# Patient Record
Sex: Male | Born: 1960 | Race: White | Hispanic: No | Marital: Married | Smoking: Current every day smoker
Health system: Southern US, Community
[De-identification: ages and names within clinical notes are randomized; demographics above are authoritative.]

---

## 2002-04-24 ENCOUNTER — Emergency Department (HOSPITAL_COMMUNITY): Admission: EM | Admit: 2002-04-24 | Discharge: 2002-04-25 | Payer: Self-pay | Admitting: Emergency Medicine

## 2002-04-25 ENCOUNTER — Encounter: Payer: Self-pay | Admitting: Emergency Medicine

## 2008-04-23 ENCOUNTER — Emergency Department (HOSPITAL_COMMUNITY): Admission: EM | Admit: 2008-04-23 | Discharge: 2008-04-23 | Payer: Self-pay | Admitting: Emergency Medicine

## 2009-05-31 ENCOUNTER — Emergency Department (HOSPITAL_COMMUNITY): Admission: EM | Admit: 2009-05-31 | Discharge: 2009-06-01 | Payer: Self-pay | Admitting: Emergency Medicine

## 2012-09-06 ENCOUNTER — Emergency Department (HOSPITAL_COMMUNITY)
Admission: EM | Admit: 2012-09-06 | Discharge: 2012-09-06 | Disposition: A | Discharge status: Home / Self Care | Payer: Self-pay | Attending: Emergency Medicine | Admitting: Emergency Medicine

## 2012-09-06 ENCOUNTER — Encounter (HOSPITAL_COMMUNITY): Payer: Self-pay | Admitting: Adult Health

## 2012-09-06 ENCOUNTER — Emergency Department (HOSPITAL_COMMUNITY): Payer: Self-pay

## 2012-09-06 DIAGNOSIS — K029 Dental caries, unspecified: Secondary | ICD-10-CM

## 2012-09-06 DIAGNOSIS — K4090 Unilateral inguinal hernia, without obstruction or gangrene, not specified as recurrent: Secondary | ICD-10-CM

## 2012-09-06 DIAGNOSIS — M542 Cervicalgia: Secondary | ICD-10-CM | POA: Insufficient documentation

## 2012-09-06 DIAGNOSIS — F17200 Nicotine dependence, unspecified, uncomplicated: Secondary | ICD-10-CM | POA: Insufficient documentation

## 2012-09-06 DIAGNOSIS — Z792 Long term (current) use of antibiotics: Secondary | ICD-10-CM | POA: Insufficient documentation

## 2012-09-06 DIAGNOSIS — K089 Disorder of teeth and supporting structures, unspecified: Secondary | ICD-10-CM | POA: Insufficient documentation

## 2012-09-06 LAB — CBC WITH DIFFERENTIAL/PLATELET
Hemoglobin: 14.5 g/dL (ref 13.0–17.0)
Lymphocytes Relative: 17 % (ref 12–46)
Lymphs Abs: 1.6 10*3/uL (ref 0.7–4.0)
MCV: 93.8 fL (ref 78.0–100.0)
Neutrophils Relative %: 71 % (ref 43–77)
Platelets: 166 10*3/uL (ref 150–400)
RBC: 4.39 MIL/uL (ref 4.22–5.81)
WBC: 9 10*3/uL (ref 4.0–10.5)

## 2012-09-06 LAB — BASIC METABOLIC PANEL
CO2: 27 mEq/L (ref 19–32)
Chloride: 103 mEq/L (ref 96–112)
Glucose, Bld: 142 mg/dL — ABNORMAL HIGH (ref 70–99)
Potassium: 3.7 mEq/L (ref 3.5–5.1)
Sodium: 140 mEq/L (ref 135–145)

## 2012-09-06 MED ORDER — ONDANSETRON HCL 4 MG/2ML IJ SOLN
4.0000 mg | Freq: Once | INTRAMUSCULAR | Status: AC
  Administered 2012-09-06: 4 mg via INTRAVENOUS
  Filled 2012-09-06: qty 2

## 2012-09-06 MED ORDER — SODIUM CHLORIDE 0.9 % IV BOLUS (SEPSIS)
1000.0000 mL | Freq: Once | INTRAVENOUS | Status: AC
  Administered 2012-09-06: 1000 mL via INTRAVENOUS

## 2012-09-06 MED ORDER — HYDROCODONE-ACETAMINOPHEN 5-325 MG PO TABS: 1.0000 | ORAL_TABLET | ORAL | Status: DC | PRN

## 2012-09-06 MED ORDER — MORPHINE SULFATE 4 MG/ML IJ SOLN
4.0000 mg | Freq: Once | INTRAMUSCULAR | Status: AC
  Administered 2012-09-06: 4 mg via INTRAVENOUS
  Filled 2012-09-06: qty 1

## 2012-09-06 MED ORDER — PENICILLIN V POTASSIUM 500 MG PO TABS: 500.0000 mg | ORAL_TABLET | Freq: Three times a day (TID) | ORAL | Status: DC

## 2012-09-06 MED ORDER — IOHEXOL 300 MG/ML  SOLN
75.0000 mL | Freq: Once | INTRAMUSCULAR | Status: AC | PRN
  Administered 2012-09-06: 75 mL via INTRAVENOUS

## 2012-09-06 NOTE — ED Provider Notes (Signed)
Medical screening examination/treatment/procedure(s) were performed by non-physician practitioner and as supervising physician I was immediately available for consultation/collaboration.   Johntae Broxterman, MD 09/06/12 2126 

## 2012-09-06 NOTE — ED Notes (Signed)
Pt c/o swelling and pain to left side of mouth and neck. Pt states pain has been going on for 3 days. Visible swelling noted to left side of mouth, poor dental hygiene. Pt rates pain 8/10. Pt states he has been taking amoxicillin that he got from a friend for two days.

## 2012-09-06 NOTE — ED Notes (Signed)
Discharge instructions reviewed. Pt verbalized understanding.  

## 2012-09-06 NOTE — ED Provider Notes (Signed)
CSN: [id number]     Arrival date & time 09/06/12  1913 History   First MD Initiated Contact with Patient 09/06/12 1942     Chief Complaint  Patient presents with  . Dental Pain   (Consider location/radiation/quality/duration/timing/severity/associated sxs/prior Treatment) HPI Comments: The patient is a 52 year old otherwise healthy male who presents with dental pain that started gradually 3 days ago. The dental pain is severe, constant and progressively worsening. The pain is aching and located in his left lower jaw. The pain radiates into his left neck and has associated swelling. Palpation of the neck makes the pain worse. Nothing makes the pain better. The patient has not tried anything for pain. No associated "feeling ill." Patient denies headache, fever, NVD, edema, sore throat, throat swelling, wheezing, SOB, chest pain, abdominal pain.     Patient is a 52 y.o. male presenting with tooth pain.  Dental Pain Associated symptoms: neck pain     History reviewed. No pertinent past medical history. History reviewed. No pertinent past surgical history. History reviewed. No pertinent family history. History  Substance Use Topics  . Smoking status: Current Every Day Smoker    Types: Cigarettes  . Smokeless tobacco: Not on file  . Alcohol Use: No   OB History   Grav Para Term Preterm Abortions TAB SAB Ect Mult Living                 Review of Systems  HENT: Positive for neck pain and dental problem.   All other systems reviewed and are negative.    Allergies  Review of patient's allergies indicates no known allergies.  Home Medications   Current Outpatient Rx  Name  Route  Sig  Dispense  Refill  . amoxicillin (AMOXIL) 875 MG tablet   Oral   Take 875 mg by mouth 2 (two) times daily.          BP 117/76  Pulse 111  Temp(Src) 98.9 F (37.2 C) (Oral)  Resp 16  Ht 5\' 9"  (1.753 m)  Wt 130 lb 9 oz (59.223 kg)  BMI 19.27 kg/m2  SpO2 98% Physical Exam  Nursing note and  vitals reviewed. Constitutional: She is oriented to person, place, and time. She appears well-developed and well-nourished. No distress.  HENT:  Head: Normocephalic and atraumatic.  Mouth/Throat: Oropharynx is clear and moist. No oropharyngeal exudate.  Eyes: Conjunctivae and EOM are normal.  Neck: Normal range of motion.  Left submandibular tenderness to palpation and edema. No other focal tenderness to palpation or swelling noted.   Cardiovascular: Normal rate and regular rhythm.  Exam reveals no gallop and no friction rub.   No murmur heard. Pulmonary/Chest: Effort normal and breath sounds normal. She has no wheezes. She has no rales. She exhibits no tenderness.  Abdominal: Soft. There is no tenderness.  Musculoskeletal: Normal range of motion.  Neurological: She is alert and oriented to person, place, and time. Coordination normal.  Speech is goal-oriented. Moves limbs without ataxia.   Skin: Skin is warm and dry.  Psychiatric: She has a normal mood and affect. Her behavior is normal.    ED Course  Procedures (including critical care time) Labs Review Labs Reviewed  CBC WITH DIFFERENTIAL  BASIC METABOLIC PANEL   Imaging Review No results found.  MDM  No diagnosis found.  7:54 PM Labs pending. Patient will have IV fluids and pain medication. Patient will have CT neck with contrast to rule out abscess. Patient signed out to Felicie Morn, NP  for disposition.     Emilia Beck, PA-C 09/06/12 2022

## 2012-09-06 NOTE — ED Notes (Signed)
Presents with left lower dental pain that radiates to neck and ear. Dental caries noted. Also reports right inguinal hernia that pops out occaisionally. Not out at this time.

## 2013-08-26 ENCOUNTER — Emergency Department (HOSPITAL_COMMUNITY)
Admission: EM | Admit: 2013-08-26 | Discharge: 2013-08-26 | Disposition: A | Discharge status: Home / Self Care | Payer: Self-pay | Attending: Emergency Medicine | Admitting: Emergency Medicine

## 2013-08-26 ENCOUNTER — Emergency Department (HOSPITAL_COMMUNITY): Payer: Self-pay

## 2013-08-26 ENCOUNTER — Encounter (HOSPITAL_COMMUNITY): Payer: Self-pay | Admitting: Emergency Medicine

## 2013-08-26 DIAGNOSIS — S62319A Displaced fracture of base of unspecified metacarpal bone, initial encounter for closed fracture: Secondary | ICD-10-CM | POA: Insufficient documentation

## 2013-08-26 DIAGNOSIS — Y9389 Activity, other specified: Secondary | ICD-10-CM | POA: Insufficient documentation

## 2013-08-26 DIAGNOSIS — S6990XA Unspecified injury of unspecified wrist, hand and finger(s), initial encounter: Secondary | ICD-10-CM | POA: Insufficient documentation

## 2013-08-26 DIAGNOSIS — Y929 Unspecified place or not applicable: Secondary | ICD-10-CM | POA: Insufficient documentation

## 2013-08-26 DIAGNOSIS — Z792 Long term (current) use of antibiotics: Secondary | ICD-10-CM | POA: Insufficient documentation

## 2013-08-26 DIAGNOSIS — S62309A Unspecified fracture of unspecified metacarpal bone, initial encounter for closed fracture: Secondary | ICD-10-CM

## 2013-08-26 DIAGNOSIS — F17200 Nicotine dependence, unspecified, uncomplicated: Secondary | ICD-10-CM | POA: Insufficient documentation

## 2013-08-26 DIAGNOSIS — IMO0002 Reserved for concepts with insufficient information to code with codable children: Secondary | ICD-10-CM | POA: Insufficient documentation

## 2013-08-26 MED ORDER — OXYCODONE-ACETAMINOPHEN 5-325 MG PO TABS
2.0000 | ORAL_TABLET | Freq: Once | ORAL | Status: DC
Start: 2013-08-26 — End: 2013-08-26

## 2013-08-26 MED ORDER — HYDROMORPHONE HCL PF 1 MG/ML IJ SOLN
1.0000 mg | Freq: Once | INTRAMUSCULAR | Status: AC
  Administered 2013-08-26: 1 mg via INTRAMUSCULAR
  Filled 2013-08-26: qty 1

## 2013-08-26 MED ORDER — HYDROCODONE-ACETAMINOPHEN 5-325 MG PO TABS: 1.0000 | ORAL_TABLET | Freq: Four times a day (QID) | ORAL | Status: DC | PRN

## 2013-08-26 MED ORDER — NAPROXEN 500 MG PO TABS: 500.0000 mg | ORAL_TABLET | Freq: Two times a day (BID) | ORAL | Status: AC

## 2013-08-26 NOTE — ED Notes (Signed)
Rt hand pain after falling on it last night swelling

## 2013-08-26 NOTE — ED Notes (Signed)
Hand Surgeon at the bedside.  
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
## 2013-08-26 NOTE — Discharge Instructions (Signed)
Call Dr. Carlos Levering office in 2 weeks for a follow up appointment. Continue to wear your brace until you have been told to remove it by Dr. Amanda Pea. Elevate your hand above your heart to reduce swelling, Ice 3-4 times a day. Call for a follow up appointment with a Family or Primary Care Provider.  Return if Symptoms worsen.   Take medication as prescribed.     Emergency Department Resource Guide 1) Find a Doctor and Pay Out of Pocket Although you won't have to find out who is covered by your insurance plan, it is a good idea to ask around and get recommendations. You will then need to call the office and see if the doctor you have chosen will accept you as a new patient and what types of options they offer for patients who are self-pay. Some doctors offer discounts or will set up payment plans for their patients who do not have insurance, but you will need to ask so you aren't surprised when you get to your appointment.  2) Contact Your Local Health Department Not all health departments have doctors that can see patients for sick visits, but many do, so it is worth a call to see if yours does. If you don't know where your local health department is, you can check in your phone book. The CDC also has a tool to help you locate your state's health department, and many state websites also have listings of all of their local health departments.  3) Find a Walk-in Clinic If your illness is not likely to be very severe or complicated, you may want to try a walk in clinic. These are popping up all over the country in pharmacies, drugstores, and shopping centers. They're usually staffed by nurse practitioners or physician assistants that have been trained to treat common illnesses and complaints. They're usually fairly quick and inexpensive. However, if you have serious medical issues or chronic medical problems, these are probably not your best option.  No Primary Care Doctor: - Call Health Connect at   [phone] - they can help you locate a primary care doctor that  accepts your insurance, provides certain services, etc. - Physician Referral Service- [phone]  Chronic Pain Problems: Organization         Address  Phone   Notes  [facility]  [phone] Patients need to be referred by their primary care doctor.   Medication Assistance: Organization         Address  Phone   Notes  [facility] Medication [facility] [address] [place], [place] [address] [phone] --Must be a resident of [facility] -- Must have NO insurance coverage whatsoever (no Medicaid/ Medicare, etc.) -- The pt. MUST have a primary care doctor that directs their care regularly and follows them in the community   MedAssist  [phone]   [employer]  [phone]    Agencies that provide inexpensive medical care: Organization         Address  Phone   Notes  [facility]  [phone]   [facility]    [phone]   [facility] [address] [place], [place] [address] [phone]   [facility] [address] [place] [place] [phone]   Planned Parenthood    [phone]   [facility]    [phone]   [facility]  [address], [place] Phone:  [phone], Fax:  [phone] Hours of Operation:  9 am - 6 pm, M-F.  Also accepts Medicaid/Medicare and self-pay.  [facility]  [address], [place] Phone: [phone]) [phone], Fax: [phone]. Hours of Operation:  8:30 am - 5:30 pm, M-F.  Also accepts Medicaid and self-pay.  [facility] [place] [address], [place] Phone: [phone](336) [phone]   [facility] [address], [place], [place] [phone], Ext. [phone] Mondays & Thursdays: 7-9 AM.  First 15 patients are seen on a first come, first serve basis.     Medicaid-accepting [facility] Providers:  Organization         Address  Phone   Notes  [facility] [address], [place] [phone] Also accepts self-pay patients.  [facility] [address] [place] [address], [place]  [phone](336) [phone]   [facility] [address], [place] [phone]) [phone]   [facility] [address] [place] [address], [place] [phone]) [phone]   Renaye RakersVeita Bland [address], [place]   [phone] Only accepts [place] Access [place] patients after they have their name applied to their card.   Self-Pay (no insurance) in [facility]:  Organization         Address  Phone   Notes  Sickle Cell Patients, [facility] [address], [place] [phone]) [phone]   [facility] [address], [place] [phone]) [phone]   [facility] Olivia Lopez de Gutierrez  [address], [place] [phone]) [phone]   [facility]/Dr. Osei-Bonsu  [address] [place] [address], [place] or [phone], [place] [phone]) [phone] Phone number for both [place] and [place] locations is the same.  [facility] [address], [place] [phone]) [phone]   [facility] [place] [address] [place] [address], [place] or [address] [phone]) [phone]   [facility] [address], [place] [phone], phone; [phone](336) [phone], fax Sees patients 1st and 3rd Saturday of every month.  Must not qualify for public or private insurance (i.e. Medicaid, Medicare, [place] Health Choice, Veterans' Benefits)  Household income should be no more than 200% of the poverty level The clinic cannot treat you if you are pregnant or think you are pregnant  Sexually transmitted diseases are not treated at the clinic.    Dental Care: Organization         Address  Phone  Notes  [facility]  [facility] [address], [place] [phone] Accepts children up to age 53 who are enrolled in [place] or [place] Health Choice; pregnant women with a Medicaid card; and children who have applied for Medicaid or [place] Health Choice, but were declined, whose parents can pay a reduced fee at time of service.  [facility] Department of [facility] [place]  [address], [place] [phone]) [phone] Accepts children up to age 11021 who are enrolled in [place] or [place] Health Choice; pregnant women with a Medicaid card; and children who have applied for Medicaid or [place] Health Choice, but were declined, whose parents can pay a reduced fee at time of service.  [facility]  [address], [place] [phone]) [phone]  Patients are seen by appointment only. Walk-ins are not accepted. [place] will see patients 64 years of age and older. Monday - Tuesday (8am-5pm) Most Wednesdays (8:30-5pm) $30 per visit, cash only  [facility]  [address], [facility] [phone] Patients are seen by appointment only. Walk-ins are not accepted. [place] will see patients 66 years of age and older. One Wednesday Evening (Monthly: Volunteer Based).  $30 per visit, cash only  [place]  [phone] for adults; Children under age 47, call Graduate Pediatric Dentistry at [phone]. Children aged 45-14, please call [phone] to request a pediatric application.  Dental services are provided in all areas of dental care including fillings, crowns and bridges, complete and partial dentures, implants, gum treatment, root canals, and extractions. Preventive care is also provided. Treatment is provided to both adults and children. Patients are selected via a lottery and there is often a waiting list.   [facility] [address] [place]  [phone]  [email or web address]   [facility] [address] [place], [place] [phone], Ext. [phone] Second and Fourth Thursday of each month, opens at 6:30 AM; Clinic ends at 9 AM.  Patients are seen on a first-come first-served basis, and a limited number are seen during each clinic.   [facility]  [address]anker [place], [place] [phone]   Eligibility Requirements You must have lived in [place], [place], or [place] counties for at least the last three months.   You cannot be eligible for state or federal sponsored [employer], including [employer], [place], or [employer].   You generally cannot be eligible for healthcare insurance through your employer.    How to apply: Eligibility screenings are held every Tuesday and Wednesday afternoon from 1:00 pm until 4:00 pm. You do not need an appointment for the interview!  [facility] [address] [place], [place]   [place]  [place] Department  [place]  [phone]    Behavioral Health Resources in the Community: Intensive Outpatient Programs Organization         Address  Phone  Notes  [place]. [address] [place], [place] [phone]   [facility] Outpatient [address] [place], [place]   ADS: Alcohol & Drug S[facility]cs [address], [place], [place]   [place] [address]  [place], [place] or [phone]   Substance Abuse Resources Organization         Address  Phone  Notes  Alcohol and Drug Services  [phone]   [place]  [phone]   [facility] [place]   Chinita Pester  [phone]   Residential & Outpatient Substance Abuse Program  [phone]   Psychological Services Organization          Address  Phone  Notes  [facility] [place]  [place]  [phone]   [place] [address], [place] or [phone]    Mobile Crisis Teams Organization         Address  Phone  Notes  [facility], Mobile Crisis Care Unit  [phone]   [facility]  [address] [place], [place]   [facility] [address] [place]  [address] [place] [phone]    Self-Help/Support Groups Organization         Address  Phone             Notes  [facility]ental Health Assoc. of [place] - variety of support groups  [phone]- I7437963 Call for more information  Narcotics Anonymous (NA), [facility] [address], [employer] [place]  2 meetings at this location   Statistician         Address  Phone  Notes  [facility] [address],    [place]  [phone]   [facility]  [address] [place] [address]213, [place], [place] [phone]   [facility] [address] [place], [place] [phone] Admissions: 8am-3pm M-F  [facility] [address]    [place], [place] [phone]   [facility] [address] [place], [place], [place] [phone]   [facility] [address],  [place], [place] [phone]   [facility] - Intensive Outpatient [address], [place], [place] [phone]   [facility] (Addiction Recovery Care [facility] [address] [place].,  [place], [place] [phone] or [phone]   [facility] (RTS) [address], [place], [place] [phone] Accepts Medicaid  [facility] [place] [address],  [place] [phone] Substance Abuse/Addiction Treatment   [facility] Organization         Address  Phone  Notes  [facility]  [phone]   Angie Fava, PhD [address] [place], [place]   [phone] or [phone]   [facility]   [address] [place], [place] [phone]   [facility] [address] [place] [address], [place], [place] [phone] Insurance/Medicaid/sponsorship through [facility] Families [address]                                    [place], [place] [phone] Therapy/tele-psych/case  [facility] [address], [place] [phone]    Dr. Lolly Mustache  [phone]   [facility] [place]  [employer] [facility] Dept. 1) [address], [place] 2) [address] [place] 3)  [address], [place] [phone]  [phone]   [facility] Child Abuse Hotline 845-194-1395 or [phone] (After Hours)

## 2013-08-26 NOTE — ED Provider Notes (Signed)
CSN: [id number]     Arrival date & time 08/26/13  1051 History  This chart was scribed for non-physician practitioner, Mellody DrownLauren Karlin Binion, PA-C working with Rolland PorterMark James, MD by Greggory StallionKayla Andersen, ED scribe. This patient was seen in room TR04C/TR04C and the patient's care was started at 11:34 AM.   Chief Complaint  Patient presents with  . Hand Pain   HPI Comments: Todd Huber is a 53 y.o. male who presents to the Emergency Department complaining of right hand pain since last night. The patient reports he punched a TV stand last night. Denies elbow or shoulder pain.   The history is provided by the patient. No language interpreter was used.    History reviewed. No pertinent past medical history. History reviewed. No pertinent past surgical history. No family history on file. History  Substance Use Topics  . Smoking status: Current Every Day Smoker    Types: Cigarettes  . Smokeless tobacco: Not on file  . Alcohol Use: No    Review of Systems  Musculoskeletal: Positive for arthralgias and joint swelling.  All other systems reviewed and are negative.  Allergies  Review of patient's allergies indicates no known allergies.  Home Medications   Prior to Admission medications   Medication Sig Start Date End Date Taking? Authorizing Provider  amoxicillin (AMOXIL) 875 MG tablet Take 875 mg by mouth 2 (two) times daily.    Historical Provider, MD  HYDROcodone-acetaminophen (NORCO/VICODIN) 5-325 MG per tablet Take 1 tablet by mouth every 4 (four) hours as needed for pain. 09/06/12   Jimmye Normanavid John Smith, NP  penicillin v potassium (VEETID) 500 MG tablet Take 1 tablet (500 mg total) by mouth 3 (three) times daily. 09/06/12   Jimmye Normanavid John Smith, NP   BP 115/74  Pulse 83  Temp(Src) 98.2 F (36.8 C) (Oral)  Resp 16  SpO2 99%  Physical Exam  Nursing note and vitals reviewed. Constitutional: He is oriented to person, place, and time. He appears well-developed and well-nourished.  Non-toxic appearance. He  does not have a sickly appearance. He does not appear ill. No distress.  HENT:  Head: Normocephalic and atraumatic.  Eyes: Conjunctivae and EOM are normal.  Neck: Neck supple.  Pulmonary/Chest: Effort normal. No respiratory distress.  Musculoskeletal: Normal range of motion.       Right hand: He exhibits bony tenderness and swelling.  Loss of the fifth knuckle contour, tenderness palpation over the fifth metacarpal good cap refill good sensation.  Neurological: He is alert and oriented to person, place, and time.  Skin: Skin is warm and dry. He is not diaphoretic.  Psychiatric: He has a normal mood and affect. His behavior is normal.    ED Course  Procedures (including critical care time)  COORDINATION OF CARE: 11:34 AM-Discussed treatment plan which includes xray with pt at bedside and pt agreed to plan.   Labs Review Labs Reviewed - No data to display  Imaging Review Dg Hand Complete Right  08/26/2013   CLINICAL DATA:  Pain.  EXAM: RIGHT HAND - COMPLETE 3+ VIEW  COMPARISON:  None.  FINDINGS: Slightly comminuted angulated fracture of the base of the right fifth metacarpal is present. No other associated abnormalities identified.  IMPRESSION: Slightly comminuted angulated fracture base right fifth metacarpal present.   Electronically Signed   By: Maisie Fushomas  Register   On: 08/26/2013 11:48     EKG Interpretation None      MDM   Final diagnoses:  Metacarpal bone fracture, closed, initial encounter  And presents with a slightly comminuted angulated fracture at base of the fifth metacarpal. Discussed with Dr. Lucilla Lame PA in ED. Reduction performed in ED. Pt to followup with [facility] in 2 weeks.   Meds given in ED:  Medications  HYDROmorphone (DILAUDID) injection 1 mg (1 mg Intramuscular Given 08/26/13 1255)    New Prescriptions   HYDROCODONE-ACETAMINOPHEN (NORCO/VICODIN) 5-325 MG PER TABLET    Take 1 tablet by mouth every 6 (six) hours as needed.   NAPROXEN (NAPROSYN)  500 MG TABLET    Take 1 tablet (500 mg total) by mouth 2 (two) times daily with a meal.   I personally performed the services described in this documentation, which was scribed in my presence. The recorded information has been reviewed and is accurate.  Mellody Drown, PA-C 08/26/13 1430

## 2013-08-26 NOTE — Progress Notes (Signed)
Orthopedic Tech Progress Note Patient Details:  Todd Huber 10-05-1960 [id number]  Casting Type of Cast: Short arm cast Cast Material: Fiberglass     CammerMickie Bail, [name] Carol 08/26/2013, 3:27 PM

## 2013-08-29 NOTE — ED Provider Notes (Signed)
Medical screening examination/treatment/procedure(s) were performed by non-physician practitioner and as supervising physician I was immediately available for consultation/collaboration.   EKG Interpretation None        Rolland PorterMark Heith Haigler, MD 08/29/13 2354

## 2014-01-01 ENCOUNTER — Encounter (HOSPITAL_COMMUNITY): Payer: Self-pay | Admitting: Emergency Medicine

## 2014-01-01 ENCOUNTER — Emergency Department (HOSPITAL_COMMUNITY)
Admission: EM | Admit: 2014-01-01 | Discharge: 2014-01-02 | Disposition: A | Discharge status: Home / Self Care | Payer: Self-pay | Attending: Emergency Medicine | Admitting: Emergency Medicine

## 2014-01-01 DIAGNOSIS — Z23 Encounter for immunization: Secondary | ICD-10-CM | POA: Insufficient documentation

## 2014-01-01 DIAGNOSIS — Y9289 Other specified places as the place of occurrence of the external cause: Secondary | ICD-10-CM | POA: Insufficient documentation

## 2014-01-01 DIAGNOSIS — Z8781 Personal history of (healed) traumatic fracture: Secondary | ICD-10-CM | POA: Insufficient documentation

## 2014-01-01 DIAGNOSIS — S61212A Laceration without foreign body of right middle finger without damage to nail, initial encounter: Secondary | ICD-10-CM | POA: Insufficient documentation

## 2014-01-01 DIAGNOSIS — Z720 Tobacco use: Secondary | ICD-10-CM | POA: Insufficient documentation

## 2014-01-01 DIAGNOSIS — Y288XXA Contact with other sharp object, undetermined intent, initial encounter: Secondary | ICD-10-CM | POA: Insufficient documentation

## 2014-01-01 DIAGNOSIS — Y9389 Activity, other specified: Secondary | ICD-10-CM | POA: Insufficient documentation

## 2014-01-01 DIAGNOSIS — S6991XA Unspecified injury of right wrist, hand and finger(s), initial encounter: Secondary | ICD-10-CM | POA: Insufficient documentation

## 2014-01-01 DIAGNOSIS — IMO0002 Reserved for concepts with insufficient information to code with codable children: Secondary | ICD-10-CM

## 2014-01-01 DIAGNOSIS — Z79899 Other long term (current) drug therapy: Secondary | ICD-10-CM | POA: Insufficient documentation

## 2014-01-01 DIAGNOSIS — Z791 Long term (current) use of non-steroidal anti-inflammatories (NSAID): Secondary | ICD-10-CM | POA: Insufficient documentation

## 2014-01-01 DIAGNOSIS — Y998 Other external cause status: Secondary | ICD-10-CM | POA: Insufficient documentation

## 2014-01-01 NOTE — ED Notes (Signed)
Pt with laceration to R middle finger from broken dish. Bleeding controlled in triage. Pt also c/o pain to R hand d/t previous break.

## 2014-01-02 MED ORDER — LIDOCAINE HCL (PF) 1 % IJ SOLN
5.0000 mL | Freq: Once | INTRAMUSCULAR | Status: AC
  Administered 2014-01-02: 5 mL via INTRADERMAL
  Filled 2014-01-02: qty 5

## 2014-01-02 MED ORDER — TETANUS-DIPHTH-ACELL PERTUSSIS 5-2.5-18.5 LF-MCG/0.5 IM SUSP: 0.5000 mL | Freq: Once | INTRAMUSCULAR | Status: DC

## 2014-01-02 MED ORDER — TETANUS-DIPHTH-ACELL PERTUSSIS 5-2.5-18.5 LF-MCG/0.5 IM SUSP
0.5000 mL | Freq: Once | INTRAMUSCULAR | Status: AC
  Administered 2014-01-02: 0.5 mL via INTRAMUSCULAR
  Filled 2014-01-02: qty 0.5

## 2014-01-02 NOTE — ED Provider Notes (Signed)
CSN: [id number]     Arrival date & time 01/01/14  2332 History  This chart was scribed for non-physician practitioner working with Todd CrumbleAdeleke Oni, MD by Richarda Overlieichard Holland, ED Scribe. This patient was seen in room D35C/D35C and the patient's care was started at 12:05 AM.    Chief Complaint  Patient presents with  . Laceration   Patient is a 53 y.o. male presenting with skin laceration. The history is provided by the patient and medical records. No language interpreter was used.  Laceration  HPI Comments: Drue FlirtCraven Z Gangi is a 53 y.o. male who presents to the Emergency Department complaining of a laceration to his right middle finger that occurred 2 hours ago. Pt states he cut his finger on a broken dish. He says that he broke the dish when he cut his finger. He states his last tetanus shot was a couple years ago, but can't remember exactly when. He says that he is right handed. Pt reports no pertinent medical history at this time. He reports no alleviating factors at this time. He reports NKDA.    Pt also complains of right hand pain and reports a he broke his right hand 5 months ago. He states he did not wear the cast or splint as long as he was instructed. He states his hand pain worsens with certain movements.  He reports he did not follow-up with the hand specialist either.    Pt also c/o a right lower abd hernia that "feels full" but is not painful.  Pt reports this has been present > 6mos, but he has not sought treatment for this.  Pt denies abd pain, N/V/D, weakness, dizziness, syncope, dysuria, hematuria, testicular pain or penile pain.    History reviewed. No pertinent past medical history. History reviewed. No pertinent past surgical history. No family history on file. History  Substance Use Topics  . Smoking status: Current Every Day Smoker    Types: Cigarettes  . Smokeless tobacco: Not on file  . Alcohol Use: No    Review of Systems  Constitutional: Negative for fever, diaphoresis,  appetite change, fatigue and unexpected weight change.  HENT: Negative for mouth sores.   Eyes: Negative for visual disturbance.  Respiratory: Negative for cough, chest tightness, shortness of breath and wheezing.   Cardiovascular: Negative for chest pain.  Gastrointestinal: Negative for nausea, vomiting, abdominal pain, diarrhea and constipation.       Hernia  Endocrine: Negative for polydipsia, polyphagia and polyuria.  Genitourinary: Negative for dysuria, urgency, frequency and hematuria.  Musculoskeletal: Positive for arthralgias (right hand). Negative for back pain and neck stiffness.  Skin: Positive for wound. Negative for rash.  Allergic/Immunologic: Negative for immunocompromised state.  Neurological: Negative for syncope, weakness, light-headedness, numbness and headaches.  Hematological: Does not bruise/bleed easily.  Psychiatric/Behavioral: Negative for sleep disturbance. The patient is not nervous/anxious.   All other systems reviewed and are negative.   Allergies  Review of patient's allergies indicates no known allergies.  Home Medications   Prior to Admission medications   Medication Sig Start Date End Date Taking? Authorizing Provider  acetaminophen (TYLENOL) 325 MG tablet Take 650 mg by mouth every 6 (six) hours as needed for headache.    Historical Provider, MD  HYDROcodone-acetaminophen (NORCO/VICODIN) 5-325 MG per tablet Take 1 tablet by mouth every 6 (six) hours as needed. 08/26/13   Mellody DrownLauren Parker, PA-C  naproxen (NAPROSYN) 500 MG tablet Take 1 tablet (500 mg total) by mouth 2 (two) times daily with a meal. 08/26/13  Lauren Jimmey RalphParker, PA-C   BP 128/73 mmHg  Pulse 100  Temp(Src) 98.3 F (36.8 C) (Oral)  Resp 18  Ht 5\' 9"  (1.753 m)  Wt 137 lb (62.143 kg)  BMI 20.22 kg/m2  SpO2 96% Physical Exam  Constitutional: He is oriented to person, place, and time. He appears well-developed and well-nourished. No distress.  Awake, alert, nontoxic appearance  HENT:  Head:  Normocephalic and atraumatic.  Mouth/Throat: Oropharynx is clear and moist. No oropharyngeal exudate.  Eyes: Conjunctivae are normal. Right eye exhibits no discharge. Left eye exhibits no discharge. No scleral icterus.  Neck: Normal range of motion. Neck supple. No tracheal deviation present.  Cardiovascular: Normal rate, regular rhythm, normal heart sounds and intact distal pulses.   No murmur heard. Capillary refill < 3 sec  Pulmonary/Chest: Effort normal and breath sounds normal. No respiratory distress. He has no wheezes.  Equal chest expansion  Abdominal: Soft. Bowel sounds are normal. He exhibits no distension and no mass. There is no tenderness. There is no rebound and no guarding. A hernia is present. Hernia confirmed positive in the right inguinal area. Hernia confirmed negative in the left inguinal area.  abd soft and nontender  Genitourinary: Testes normal and penis normal.     Easily reducible direct inguinal hernia  Musculoskeletal: Normal range of motion. He exhibits no edema.  ROM: full ROM of all fingers of the right hand, right rist and right elbow 3cm laceration to the right middle finger  Lymphadenopathy:       Right: No inguinal adenopathy present.       Left: No inguinal adenopathy present.  Neurological: He is alert and oriented to person, place, and time.  Sensation: intact to dull and sharp in all fingers of the right hand, including the tip of the right middle finger Strength: 5/5 including strong grip strength  Skin: Skin is warm and dry. He is not diaphoretic.  Psychiatric: He has a normal mood and affect. His behavior is normal.  Nursing note and vitals reviewed.   ED Course  LACERATION REPAIR Date/Time: 01/02/2014 12:54 AM Performed by: Dierdre ForthMUTHERSBAUGH, Easton Sivertson Authorized by: Dierdre ForthMUTHERSBAUGH, Deyra Perdomo Consent: Verbal consent obtained. Risks and benefits: risks, benefits and alternatives were discussed Consent given by: patient Patient understanding: patient  states understanding of the procedure being performed Patient consent: the patient's understanding of the procedure matches consent given Procedure consent: procedure consent matches procedure scheduled Relevant documents: relevant documents present and verified Site marked: the operative site was marked Required items: required blood products, implants, devices, and special equipment available Patient identity confirmed: verbally with patient and arm band Time out: Immediately prior to procedure a "time out" was called to verify the correct patient, procedure, equipment, support staff and site/side marked as required. Body area: upper extremity Location details: right long finger Laceration length: 3 cm Foreign bodies: no foreign bodies Tendon involvement: none Nerve involvement: none Vascular damage: no Anesthesia: digital block Local anesthetic: lidocaine 1% without epinephrine Anesthetic total: 5 ml Patient sedated: no Preparation: Patient was prepped and draped in the usual sterile fashion. Irrigation solution: saline Irrigation method: syringe Amount of cleaning: extensive Debridement: none Degree of undermining: none Skin closure: 5-0 Prolene Number of sutures: 5 Technique: simple Approximation: close Approximation difficulty: complex Dressing: 4x4 sterile gauze Patient tolerance: Patient tolerated the procedure well with no immediate complications     DIAGNOSTIC STUDIES: Oxygen Saturation is 96% on RA, normal by my interpretation.    COORDINATION OF CARE: 12:15 AM Discussed treatment plan with pt  at bedside and pt agreed to plan.   Labs Review Labs Reviewed - No data to display  Imaging Review No results found.   EKG Interpretation None      MDM   Final diagnoses:  Laceration of right middle finger w/o foreign body w/o damage to nail, initial encounter    CHRISTOS MIXSON presents with laceration to the right middle finger.  Tdap booster given.Pressure  irrigation performed. Laceration occurred < 8 hours prior to repair which was well tolerated. Pt has no co morbidities to effect normal wound healing. Discussed suture home care w pt and answered questions. Pt to f-u for wound check and suture removal in 7 days.   Pt also c/o hernia.  PE with right inguinal hernia, easily reproducible without associated abd pain, N/V or other concerning symptoms.  Pt is to follow-up with [facility] as well as [facility] for this.    Pt also c/o persistent, but intermittent right hand pain with certain movements since he obtained a boxer's fracture approx 5 mos ago.  Sensation, grip strength and ROM are WNL; encouraged f/u with initial hand surgeon.    I have personally reviewed patient's vitals, nursing note and any pertinent labs or imaging.  I performed an focused physical exam; undressed when appropriate .    It has been determined that no acute conditions requiring further emergency intervention are present at this time. The patient/guardian have been advised of the diagnosis and plan. I reviewed any labs and imaging including any potential incidental findings. We have discussed signs and symptoms that warrant return to the ED and they are listed in the discharge instructions.    Vital signs are stable at discharge.   BP 128/73 mmHg  Pulse 100  Temp(Src) 98.3 F (36.8 C) (Oral)  Resp 18  Ht 5\' 9"  (1.753 m)  Wt 137 lb (62.143 kg)  BMI 20.22 kg/m2  SpO2 96%       Dierdre Forth, PA-C 01/02/14 1610  Todd Crumble, MD 01/02/14 [id number]

## 2014-01-02 NOTE — Discharge Instructions (Signed)

## 2014-09-18 ENCOUNTER — Emergency Department (HOSPITAL_COMMUNITY)
Admission: EM | Admit: 2014-09-18 | Discharge: 2014-09-18 | Disposition: A | Discharge status: Home / Self Care | Payer: Self-pay | Attending: Emergency Medicine | Admitting: Emergency Medicine

## 2014-09-18 ENCOUNTER — Encounter (HOSPITAL_COMMUNITY): Payer: Self-pay | Admitting: Emergency Medicine

## 2014-09-18 DIAGNOSIS — K4090 Unilateral inguinal hernia, without obstruction or gangrene, not specified as recurrent: Secondary | ICD-10-CM | POA: Insufficient documentation

## 2014-09-18 DIAGNOSIS — Z720 Tobacco use: Secondary | ICD-10-CM | POA: Insufficient documentation

## 2014-09-18 DIAGNOSIS — Z791 Long term (current) use of non-steroidal anti-inflammatories (NSAID): Secondary | ICD-10-CM | POA: Insufficient documentation

## 2014-09-18 DIAGNOSIS — N451 Epididymitis: Secondary | ICD-10-CM | POA: Insufficient documentation

## 2014-09-18 MED ORDER — CIPROFLOXACIN HCL 500 MG PO TABS: 500.0000 mg | ORAL_TABLET | Freq: Two times a day (BID) | ORAL | Status: AC

## 2014-09-18 MED ORDER — IBUPROFEN 800 MG PO TABS: 800.0000 mg | ORAL_TABLET | Freq: Three times a day (TID) | ORAL | Status: DC

## 2014-09-18 NOTE — ED Provider Notes (Signed)
CSN: [id number]     Arrival date & time 09/18/14  1030 History   First MD Initiated Contact with Patient 09/18/14 1133     Chief Complaint  Patient presents with  . Hernia     (Consider location/radiation/quality/duration/timing/severity/associated sxs/prior Treatment) HPI  R inguinal hernia X months - getting bgger and causing more pain Sx are persistent - has associated testicular pain on the R. But no dysuria / hematuria - some difficulty starting a stream, no n/v/d and no abd pain.  Present with standing and easily reducible.  History reviewed. No pertinent past medical history. History reviewed. No pertinent past surgical history. No family history on file. Social History  Substance Use Topics  . Smoking status: Current Every Day Smoker -- 1.00 packs/day    Types: Cigarettes  . Smokeless tobacco: None  . Alcohol Use: Yes     Comment: daily    Review of Systems  All other systems reviewed and are negative.     Allergies  Review of patient's allergies indicates no known allergies.  Home Medications   Prior to Admission medications   Medication Sig Start Date End Date Taking? Authorizing Provider  acetaminophen (TYLENOL) 325 MG tablet Take 650 mg by mouth every 6 (six) hours as needed for headache.    Historical Provider, MD  ciprofloxacin (CIPRO) 500 MG tablet Take 1 tablet (500 mg total) by mouth 2 (two) times daily. 09/18/14   Eber Hong, MD  HYDROcodone-acetaminophen (NORCO/VICODIN) 5-325 MG per tablet Take 1 tablet by mouth every 6 (six) hours as needed. 08/26/13   Mellody Drown, PA-C  ibuprofen (ADVIL,MOTRIN) 800 MG tablet Take 1 tablet (800 mg total) by mouth 3 (three) times daily. 09/18/14   Eber Hong, MD  naproxen (NAPROSYN) 500 MG tablet Take 1 tablet (500 mg total) by mouth 2 (two) times daily with a meal. 08/26/13   Mellody Drown, PA-C   BP 112/74 mmHg  Pulse 81  Temp(Src) 97.9 F (36.6 C) (Oral)  Resp 20  SpO2 97% Physical Exam  Constitutional: He  appears well-developed and well-nourished. No distress.  HENT:  Head: Normocephalic and atraumatic.  Mouth/Throat: Oropharynx is clear and moist. No oropharyngeal exudate.  Eyes: Conjunctivae and EOM are normal. Pupils are equal, round, and reactive to light. Right eye exhibits no discharge. Left eye exhibits no discharge. No scleral icterus.  Neck: Normal range of motion. Neck supple. No JVD present. No thyromegaly present.  Cardiovascular: Normal rate, regular rhythm, normal heart sounds and intact distal pulses.  Exam reveals no gallop and no friction rub.   No murmur heard. Pulmonary/Chest: Effort normal and breath sounds normal. No respiratory distress. He has no wheezes. He has no rales.  Abdominal: Soft. Bowel sounds are normal. He exhibits no distension and no mass. There is no tenderness.  Genitourinary:  R inguinal hernia - easily reducible, normal cremasteric reflex, mild tenderness over the epididymis of the right testicle, normal-appearing scrotum and testicles, no masses. Normal-appearing penis without urethral discharge.   Musculoskeletal: Normal range of motion. He exhibits no edema or tenderness.  Lymphadenopathy:    He has no cervical adenopathy.  Neurological: He is alert. Coordination normal.  Skin: Skin is warm and dry. No rash noted. No erythema.  Psychiatric: He has a normal mood and affect. His behavior is normal.  Nursing note and vitals reviewed.   ED Course  Procedures (including critical care time) Labs Review Labs Reviewed - No data to display  Imaging Review No results found. I have personally  reviewed and evaluated these images and lab results as part of my medical decision-making.    MDM   Final diagnoses:  Inguinal hernia, right  Epididymitis, right    The patient has a benign abdomen, he has had intermittent hernias over the last several months, these are easily reducible, they never get strangulated, no systemic symptoms, evidence of  epididymitis, antibiotics for same, can follow up outpatient with general surgery, the patient is agreeable to this plan.    Meds given in ED:  Medications - No data to display  New Prescriptions   CIPROFLOXACIN (CIPRO) 500 MG TABLET    Take 1 tablet (500 mg total) by mouth 2 (two) times daily.   IBUPROFEN (ADVIL,MOTRIN) 800 MG TABLET    Take 1 tablet (800 mg total) by mouth 3 (three) times daily.        Eber Hong, MD 09/18/14 1242

## 2014-09-18 NOTE — Discharge Instructions (Signed)

## 2014-09-18 NOTE — ED Notes (Signed)
Patient states he has hernia in RLQ.   Patient states he was seen here previously for same.  Patient states no insurance and can't go see a Careers adviser.   Patient states now having R testicle pain.

## 2014-09-18 NOTE — ED Notes (Signed)
Patient to ED with right testicular pain due to his inguinal hernia. States that he has no insurance so he cannot see a Careers adviser.  States that the pain is worsening over time.

## 2015-11-29 ENCOUNTER — Encounter (HOSPITAL_COMMUNITY): Payer: Self-pay | Admitting: Emergency Medicine

## 2015-11-29 ENCOUNTER — Emergency Department (HOSPITAL_COMMUNITY)
Admission: EM | Admit: 2015-11-29 | Discharge: 2015-11-29 | Disposition: A | Discharge status: Home / Self Care | Payer: Self-pay | Attending: Emergency Medicine | Admitting: Emergency Medicine

## 2015-11-29 ENCOUNTER — Emergency Department (HOSPITAL_COMMUNITY): Payer: Self-pay

## 2015-11-29 DIAGNOSIS — Y929 Unspecified place or not applicable: Secondary | ICD-10-CM | POA: Insufficient documentation

## 2015-11-29 DIAGNOSIS — Y939 Activity, unspecified: Secondary | ICD-10-CM | POA: Insufficient documentation

## 2015-11-29 DIAGNOSIS — F17210 Nicotine dependence, cigarettes, uncomplicated: Secondary | ICD-10-CM | POA: Insufficient documentation

## 2015-11-29 DIAGNOSIS — Y999 Unspecified external cause status: Secondary | ICD-10-CM | POA: Insufficient documentation

## 2015-11-29 DIAGNOSIS — W228XXA Striking against or struck by other objects, initial encounter: Secondary | ICD-10-CM | POA: Insufficient documentation

## 2015-11-29 DIAGNOSIS — M25562 Pain in left knee: Secondary | ICD-10-CM | POA: Insufficient documentation

## 2015-11-29 MED ORDER — IBUPROFEN 600 MG PO TABS
600.0000 mg | ORAL_TABLET | Freq: Four times a day (QID) | ORAL | 0 refills | Status: AC | PRN
Start: 2015-11-29 — End: ?

## 2015-11-29 MED ORDER — HYDROCODONE-ACETAMINOPHEN 5-325 MG PO TABS: 2.0000 | ORAL_TABLET | ORAL | 0 refills | Status: AC | PRN

## 2015-11-29 MED ORDER — OXYCODONE-ACETAMINOPHEN 5-325 MG PO TABS
1.0000 | ORAL_TABLET | Freq: Once | ORAL | Status: AC
  Administered 2015-11-29: 1 via ORAL
  Filled 2015-11-29: qty 1

## 2015-11-29 NOTE — Discharge Instructions (Signed)
Take your medications as prescribed as needed for pain relief. I recommend continuing to wear your knee brace and use crutches for the next week and see her symptoms have improved. I also recommend resting, elevating and applying ice to your left knee for 15-20 minutes 3-4 times daily to help with pain and swelling. I recommend following up with the orthopedist listed below in the next week if your symptoms have not improved or have worsened. Please return to the Emergency Department if symptoms worsen or new onset of fever, redness, worsening swelling/pain, decreased range of motion, numbness, tingling, weakness.

## 2015-11-29 NOTE — ED Notes (Signed)
Papers and prescriptions reviewed with patient and he verbalizes understanding. Ortho tech applied knee imobilizer and crutches and patient demonstrates proper use of crutches

## 2015-11-29 NOTE — Progress Notes (Signed)
Orthopedic Tech Progress Note Patient Details:  Todd Huber 1960-01-30 [id number]  Ortho Devices Type of Ortho Device: Crutches, Knee Immobilizer Ortho Device/Splint Location: lle Ortho Device/Splint Interventions: Application   [name] 11/29/2015, 10:56 AM

## 2015-11-29 NOTE — ED Triage Notes (Signed)
Pt was swinging a wooden handle axe and hit his left knee with the handle pt has swelling and pain.

## 2015-11-29 NOTE — ED Notes (Signed)
Paged ortho tech for crutches. We don't have the correct size available here
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
